# Patient Record
Sex: Female | Born: 1995 | Hispanic: No | Marital: Single | Smoking: Never smoker
Health system: Southern US, Community
[De-identification: ages and names within clinical notes are randomized; demographics above are authoritative.]

## PROBLEM LIST (undated history)

## (undated) DIAGNOSIS — E559 Vitamin D deficiency, unspecified: Secondary | ICD-10-CM

## (undated) HISTORY — PX: TYMPANOSTOMY TUBE PLACEMENT: SHX32

## (undated) HISTORY — DX: Vitamin D deficiency, unspecified: E55.9

## (undated) HISTORY — PX: APPENDECTOMY: SHX54

---
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
## 2015-12-22 ENCOUNTER — Ambulatory Visit (INDEPENDENT_AMBULATORY_CARE_PROVIDER_SITE_OTHER): Payer: Managed Care, Other (non HMO) | Admitting: Family Medicine

## 2015-12-22 VITALS — BP 114/68 | HR 86 | Temp 98.7°F | Resp 16 | Ht 59.0 in | Wt 106.2 lb

## 2015-12-22 DIAGNOSIS — H9202 Otalgia, left ear: Secondary | ICD-10-CM | POA: Diagnosis not present

## 2015-12-22 DIAGNOSIS — H7202 Central perforation of tympanic membrane, left ear: Secondary | ICD-10-CM | POA: Diagnosis not present

## 2015-12-22 DIAGNOSIS — Z23 Encounter for immunization: Secondary | ICD-10-CM

## 2015-12-22 DIAGNOSIS — Z0000 Encounter for general adult medical examination without abnormal findings: Secondary | ICD-10-CM

## 2015-12-22 NOTE — Progress Notes (Signed)
Chief Complaint  Patient presents with  . Annual Exam    no pap    Subjective:  Karen Conner is a 20 y.o. female here for a health maintenance visit.  Patient is  Pt established pt  Additional Problems addressed today She reports that she has been having ear pain with an echo and tinnitus Reports that her ear pain is only on the left side Denies history of trauma Denies using qtips or sharp objects to clean the ear Denies history of swimming or diving Reports that she had swimmers ear over the summer Made an appointment on Friday, 3 days ago, to come in due to the ear pain and discomfort with chewing. 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 Perforated ear drum 05/23/2013  . Abnormal hearing screen 05/23/2013    No past medical history on file.  No past surgical history on file.    No outpatient prescriptions prior to visit.   No facility-administered medications prior to visit.     No Known Allergies  Family History  Problem Relation Age of Onset  . Hypertension Maternal Grandmother   . Hypertension Maternal Grandfather   . Hypertension Paternal Grandmother   . Hypertension Paternal Grandfather     Social History   Social History  . Marital status: Single    Spouse name: N/A  . Number of children: N/A  . Years of education: N/A   Occupational History  . Not on file.   Social History Main Topics  . Smoking status: Never Smoker  . Smokeless tobacco: Not on file  . Alcohol use Not on file  . Drug use: Unknown  . Sexual activity: Not on file   Other Topics Concern  . Not on file   Social History Narrative  . No narrative on file   History  Alcohol use Not on file   History  Smoking Status  . Never Smoker  Smokeless Tobacco  . Not on file   History  Drug use: Unknown    GYN: Sexual Health Menstrual status: regular menses LMP:  Patient's last menstrual period was 12/19/2015. Last pap smear: see HM section History of abnormal pap smears: n/a Sexually active: no Current contraception: none  Health Maintenance: See under health Maintenance activity for review of completion dates as well. Immunization History  Administered Date(s) Administered  . Influenza,inj,Quad PF,36+ Mos 12/22/2015  . Influenza,inj,quad, With Preservative 04/25/2013    [facility] 12/22/2015 04/25/2013  Decreased Interest 0 0  Down, Depressed, Hopeless 0 0  PHQ - 2 Score 0 0  Altered sleeping - 0  Change in appetite - 0  Feeling bad or failure about yourself  - 0  Trouble concentrating - 0  Suicidal thoughts - 0  PHQ-9 Score - 0     Depression Severity and Treatment Recommendations:  0-4= None  5-9= Mild / Treatment: Support, educate to call if worse; return in one month  10-14= Moderate / Treatment: Support, watchful waiting; Antidepressant or Psycotherapy  15-19= Moderately severe / Treatment: Antidepressant OR Psychotherapy  >= 20 = Major depression, severe / Antidepressant AND Psychotherapy    Review of Systems   Review of Systems  Constitutional: Negative for chills, fever and weight loss.  HENT: Positive for ear pain. Negative for hearing loss.        Left ear pain and ringing started  3 days ago  Eyes: Negative for blurred vision, double vision and photophobia.  Cardiovascular: Negative for chest pain and palpitations.  Gastrointestinal: Negative for abdominal pain, heartburn, nausea and vomiting.  Genitourinary: Negative for dysuria, frequency and urgency.  Musculoskeletal: Negative for myalgias and neck pain.  Skin: Negative for itching and rash.  Neurological: Negative for dizziness, tingling, tremors and headaches.  Endo/Heme/Allergies: Negative for environmental allergies and polydipsia. Does not bruise/bleed easily.  Psychiatric/Behavioral: Negative for depression. The patient is not nervous/anxious  and does not have insomnia.     See HPI for ROS as well.    Objective:   Vitals:   12/22/15 1353  BP: 114/68  Pulse: 86  Resp: 16  Temp: 98.7 F (37.1 C)  TempSrc: Oral  SpO2: 100%  Weight: 106 lb 3.2 oz (48.2 kg)  Height: 4\' 11"  (1.499 m)   Body mass index is 21.45 kg/m.   Body mass index is 21.45 kg/m.  Physical Exam  Constitutional: She is oriented to person, place, and time. She appears well-developed and well-nourished.  HENT:  Head: Normocephalic and atraumatic.  Right Ear: Hearing and external ear normal.  Left Ear: Hearing normal. No drainage. Tympanic membrane is perforated. Tympanic membrane is not erythematous.  Mouth/Throat: Oropharynx is clear and moist.  Eyes: Conjunctivae and EOM are normal.  Neck: Normal range of motion. Neck supple.  Cardiovascular: Normal rate and normal heart sounds.   Pulmonary/Chest: Effort normal and breath sounds normal.  Abdominal: Soft. Bowel sounds are normal. She exhibits no distension. There is no tenderness.  Musculoskeletal: Normal range of motion. She exhibits no edema.  Neurological: She is alert and oriented to person, place, and time. She has normal reflexes.  Skin: Skin is warm. No erythema.  Psychiatric: She has a normal mood and affect. Her behavior is normal. Judgment and thought content normal.       Assessment/Plan:   Patient was seen for a health maintenance exam.  Counseled the patient on health maintenance issues. Reviewed her health mainteance schedule and ordered appropriate tests (see orders.) Counseled on regular exercise and weight management. Recommend regular eye exams and dental cleaning.   The following issues were addressed today for health maintenance:  Karen Conner was seen today for annual exam.  Diagnoses and all orders for this visit:  Health maintenance examination- age appropriate screenings reviewed Pap advised after age 71   Flu vaccine need- discussed risks and benefits, flu vaccine  given today -     Flu Vaccine QUAD 36+ mos IM  Central perforation of tympanic membrane of left ear Otalgia of left ear No trauma on history -     Ambulatory referral to ENT    No Follow-up on file.   Body mass index is 21.45 kg/m.:  Discussed the patient's BMI with patient.   No future appointments.  Patient Instructions       IF you received an x-ray today, you will receive an invoice from [facility]. Please contact [facility] at [phone] with questions or concerns regarding your invoice.   IF you received labwork today, you will receive an invoice from [facility]. Please contact [facility] at [phone] with questions or concerns regarding your invoice.   Our billing staff will not be able to assist you with questions regarding bills from these companies.  You will be contacted with the lab results as soon as they are available. The fastest way to get your results is to activate your My Chart account. Instructions are  located on the last page of this paperwork. If you have not heard from [place] regarding the results in 2 weeks, please contact this office.

## 2015-12-22 NOTE — Patient Instructions (Signed)
     IF you received an x-ray today, you will receive an invoice from [facility]. Please contact [facility] at [phone] with questions or concerns regarding your invoice.   IF you received labwork today, you will receive an invoice from [facility]. Please contact [facility] at [phone] with questions or concerns regarding your invoice.   Our billing staff will not be able to assist you with questions regarding bills from these companies.  You will be contacted with the lab results as soon as they are available. The fastest way to get your results is to activate your My Chart account. Instructions are located on the last page of this paperwork. If you have not heard from us regarding the results in 2 weeks, please contact this office.      

## 2018-01-20 ENCOUNTER — Other Ambulatory Visit: Payer: Self-pay

## 2018-01-20 ENCOUNTER — Encounter: Payer: Self-pay | Admitting: Physician Assistant

## 2018-01-20 ENCOUNTER — Ambulatory Visit (INDEPENDENT_AMBULATORY_CARE_PROVIDER_SITE_OTHER): Payer: Managed Care, Other (non HMO) | Admitting: Physician Assistant

## 2018-01-20 VITALS — BP 116/72 | HR 77 | Resp 16 | Ht 59.5 in | Wt 112.8 lb

## 2018-01-20 DIAGNOSIS — Z23 Encounter for immunization: Secondary | ICD-10-CM | POA: Diagnosis not present

## 2018-01-20 DIAGNOSIS — H7291 Unspecified perforation of tympanic membrane, right ear: Secondary | ICD-10-CM

## 2018-01-20 DIAGNOSIS — H6983 Other specified disorders of Eustachian tube, bilateral: Secondary | ICD-10-CM

## 2018-01-20 DIAGNOSIS — Z0001 Encounter for general adult medical examination with abnormal findings: Secondary | ICD-10-CM | POA: Diagnosis not present

## 2018-01-20 DIAGNOSIS — H6993 Unspecified Eustachian tube disorder, bilateral: Secondary | ICD-10-CM

## 2018-01-20 DIAGNOSIS — Z13228 Encounter for screening for other metabolic disorders: Secondary | ICD-10-CM | POA: Diagnosis not present

## 2018-01-20 DIAGNOSIS — Z1329 Encounter for screening for other suspected endocrine disorder: Secondary | ICD-10-CM | POA: Diagnosis not present

## 2018-01-20 DIAGNOSIS — Z13220 Encounter for screening for lipoid disorders: Secondary | ICD-10-CM | POA: Diagnosis not present

## 2018-01-20 DIAGNOSIS — Z0000 Encounter for general adult medical examination without abnormal findings: Secondary | ICD-10-CM

## 2018-01-20 DIAGNOSIS — Z130 Encounter for screening for diseases of the blood and blood-forming organs and certain disorders involving the immune mechanism: Secondary | ICD-10-CM | POA: Diagnosis not present

## 2018-01-20 LAB — POCT URINALYSIS DIP (MANUAL ENTRY)
Bilirubin, UA: NEGATIVE
Glucose, UA: NEGATIVE mg/dL
Leukocytes, UA: NEGATIVE
Nitrite, UA: NEGATIVE
Protein Ur, POC: 30 mg/dL — AB
Spec Grav, UA: >=1.03 — AB (ref 1.010–1.025)
Urobilinogen, UA: 0.2 U/dL
pH, UA: 6 (ref 5.0–8.0)

## 2018-01-20 MED ORDER — TRIAMCINOLONE ACETONIDE 55 MCG/ACT NA AERO: 2.0000 | INHALATION_SPRAY | Freq: Every day | NASAL | 12 refills | Status: DC

## 2018-01-20 NOTE — Progress Notes (Signed)
[facility] at [facility] [address] [place] [address] [phone]  Date:  01/20/2018   Name:  Karen Conner   DOB:  April 30, 1995   MRN:  [id number]  PCP:  Patient, No Pcp Per    Chief Complaint: Annual Exam (no pap)   History of Present Illness:  This is a 22 y.o. female with PMH none who is presenting for CPE. Last CPE 12/2015.  [employer] grad in business admin. Works in Engineering geologist. Lives with parents.   Complaints: ear problem. Occasional right ear pain and it "feels stuffy". This has been a problem for her for a while. Feels like it needs to "pop". Never taken anything for this.  LMP: 12/29/2017 Contraception: none Last pap: never. Declines this today. She is nervous about a GYN exam.  Sexual history: never active.  Immunizations: needs tdap Dentist: it's been >4 years.  Eye: 20/15  Diet/Exercise: no exercise. Eats fruits, little bit of veggies. Mostly eats rice and meat. Occasional fish. Eats fast food 2x/week.  Tobacco/alcohol/substance use: none  Review of Systems:  Review of Systems  Constitutional: Negative for chills, diaphoresis, fatigue and fever.  HENT: Positive for ear pain. Negative for congestion, ear discharge, postnasal drip, rhinorrhea, sinus pressure, sneezing and sore throat.   Respiratory: Negative for cough, chest tightness, shortness of breath and wheezing.   Cardiovascular: Negative for chest pain and palpitations.  Gastrointestinal: Negative for abdominal pain, diarrhea, nausea and vomiting.  Genitourinary: Negative for decreased urine volume, difficulty urinating, dysuria, enuresis, flank pain, frequency, hematuria, urgency, vaginal bleeding, vaginal discharge and vaginal pain.  Musculoskeletal: Negative for back pain.  Neurological: Negative for dizziness, weakness, light-headedness, numbness and headaches.    Patient Active Problem List   Diagnosis Date Noted  . Perforated ear drum 05/23/2013  . Abnormal hearing screen 05/23/2013    Prior to  Admission medications   Not on File    No Known Allergies   Social History   Tobacco Use  . Smoking status: Never Smoker  Substance Use Topics  . Alcohol use: Not on file  . Drug use: Not on file    Family History  Problem Relation Age of Onset  . Hypertension Maternal Grandmother   . Hypertension Maternal Grandfather   . Hypertension Paternal Grandmother   . Hypertension Paternal Grandfather     Medication list has been reviewed and updated.  Physical Examination:  Blood pressure 116/72, pulse 77, resp. rate 16, height 4' 11.5" (1.511 m), weight 112 lb 12.8 oz (51.2 kg), last menstrual period 12/29/2017, SpO2 99 %.   Physical Exam  Constitutional: She is oriented to person, place, and time. She appears well-developed and well-nourished. No distress.  HENT:  Head: Normocephalic and atraumatic.  Mouth/Throat: Oropharynx is clear and moist.  Perforated left TM Decreased hearing left ear  Eyes: Pupils are equal, round, and reactive to light. Conjunctivae and EOM are normal.  Neck: Normal range of motion. Neck supple. No thyromegaly present.  Cardiovascular: Normal rate, regular rhythm and normal heart sounds.  No murmur heard. Pulmonary/Chest: Effort normal and breath sounds normal. She has no wheezes.  Abdominal: Soft. Bowel sounds are normal. She exhibits no distension and no mass.  Musculoskeletal: Normal range of motion.  Neurological: She is alert and oriented to person, place, and time. She has normal reflexes.  Skin: Skin is warm and dry.  Psychiatric: She has a normal mood and affect. Her behavior is normal. Judgment and thought content normal.  Vitals reviewed.   Assessment and  Plan: 1. Annual physical exam - Pt presents for annual exam. She has never had a PAP and declines this today.  She endorses symptoms consistent with possible eustachian tube dysfunction. Perforated TM of left ear, which she says she was told about several years ago. Plan to refer to  ENT for evaluation. Routine labs are pending. Flu shot administered by [facility].  Anticipatory guidance discussed.   2. Screening for endocrine, metabolic and immunity disorder - CBC with Differential/Platelet - Comprehensive metabolic panel - POCT urinalysis dipstick - VITAMIN D 25 Hydroxy (Vit-D Deficiency, Fractures) - TSH - Lipid panel - Vitamin B12  3. Screening, lipid - Lipid panel  4. Flu vaccine need - Flu Vaccine QUAD 6+ mos PF IM (Fluarix Quad PF)  5. Dysfunction of both eustachian tubes - triamcinolone (NASACORT) 55 MCG/ACT AERO nasal inhaler; Place 2 sprays into the nose daily.  Dispense: 1 Inhaler; Refill: 12  6. Perforated right tympanic membrane on examination - Ambulatory referral to ENT   Marco Collie, PA-C  [facility] at [facility] 01/20/2018 4:25 PM

## 2018-01-20 NOTE — Patient Instructions (Addendum)
Make an appointment to have a routine teeth cleaning by a dentist. Every 6 months.  You will receive a phone call to schedule an appointment with the ear specialist.  Please consider getting screened for cervical cancer.   If you have lab work done today you will be contacted with your lab results within the next 2 weeks.  If you have not heard from [place] then please contact us. The fastest way to get your results is to register for My Chart.  To help eustachian tube dysfunction, use nasacort 2x/day in your nose and clear it by plugging your nose and blowing out 5-10 x/day Consider allergy medicines during your "bad seasons".  Eustachian Tube Dysfunction The eustachian tube connects the middle ear to the back of the nose. It regulates air pressure in the middle ear by allowing air to move between the ear and nose. It also helps to drain fluid from the middle ear space. When the eustachian tube does not function properly, air pressure, fluid, or both can build up in the middle ear. Eustachian tube dysfunction can affect one or both ears. What are the causes? This condition happens when the eustachian tube becomes blocked or cannot open normally. This may result from:  Ear infections.  Colds and other upper respiratory infections.  Allergies.  Irritation, such as from cigarette smoke or acid from the stomach coming up into the esophagus (gastroesophageal reflux).  Sudden changes in air pressure, such as from descending in an airplane.  Abnormal growths in the nose or throat, such as nasal polyps, tumors, or enlarged tissue at the back of the throat (adenoids).  What increases the risk? This condition may be more likely to develop in people who smoke and people who are overweight. Eustachian tube dysfunction may also be more likely to develop in children, especially children who have:  Certain birth defects of the mouth, such as cleft palate.  Large tonsils and adenoids.  What are the  signs or symptoms? Symptoms of this condition may include:  A feeling of fullness in the ear.  Ear pain.  Clicking or popping noises in the ear.  Ringing in the ear.  Hearing loss.  Loss of balance.  Symptoms may get worse when the air pressure around you changes, such as when you travel to an area of high elevation or fly on an airplane. How is this diagnosed? This condition may be diagnosed based on:  Your symptoms.  A physical exam of your ear, nose, and throat.  Tests, such as those that measure: ? The movement of your eardrum (tympanogram). ? Your hearing (audiometry).  How is this treated? Treatment depends on the cause and severity of your condition. If your symptoms are mild, you may be able to relieve your symptoms by moving air into ("popping") your ears. If you have symptoms of fluid in your ears, treatment may include:  Decongestants.  Antihistamines.  Nasal sprays or ear drops that contain medicines that reduce swelling (steroids).  In some cases, you may need to have a procedure to drain the fluid in your eardrum (myringotomy). In this procedure, a small tube is placed in the eardrum to:  Drain the fluid.  Restore the air in the middle ear space.  Follow these instructions at home:  Take over-the-counter and prescription medicines only as told by your health care provider.  Use techniques to help pop your ears as recommended by your health care provider. These may include: ? Chewing gum. ? Yawning. ?  Frequent, forceful swallowing. ? Closing your mouth, holding your nose closed, and gently blowing as if you are trying to blow air out of your nose.  Do not do any of the following until your health care provider approves: ? Travel to high altitudes. ? Fly in airplanes. ? Work in a Estate agent or room. ? Scuba dive.  Keep your ears dry. Dry your ears completely after showering or bathing.  Do not smoke.  Keep all follow-up visits as told  by your health care provider. This is important. Contact a health care provider if:  Your symptoms do not go away after treatment.  Your symptoms come back after treatment.  You are unable to pop your ears.  You have: ? A fever. ? Pain in your ear. ? Pain in your head or neck. ? Fluid draining from your ear.  Your hearing suddenly changes.  You become very dizzy.  You lose your balance. This information is not intended to replace advice given to you by your health care provider. Make sure you discuss any questions you have with your health care provider. Document Released: 03/28/2015 Document Revised: 08/07/2015 Document Reviewed: 03/20/2014 Elsevier Interactive Patient Education  2018 [employer].  ------------------------------------------------------ Keeping You Healthy   Get These Tests  Blood Pressure- Have your blood pressure checked by your healthcare provider at least once a year.  Normal blood pressure is 120/80.  Weight- Have your body mass index (BMI) calculated to screen for obesity.  BMI is a measure of body fat based on height and weight.  You can calculate your own BMI at https://www.west-esparza.com/  Cholesterol- Have your cholesterol checked every year.  Diabetes- Have your blood sugar checked every year if you have high blood pressure, high cholesterol, a family history of diabetes or if you are overweight.  Pap Test - Have a pap test every 1 to 5 years if you have been sexually active.  If you are older than 65 and recent pap tests have been normal you may not need additional pap tests.  In addition, if you have had a hysterectomy  for benign disease additional pap tests are not necessary.   Get these medicines  Calcium with Vitamin D- Your body requires 1200-1500 mg of Calcium a day and [id number] IU of Vitamin D a day.  You can only absorb 500 mg of Calcium at a time therefore Calcium must be taken in 2 or 3 separate doses throughout the day.  Hormones-  Hormone therapy has been associated with increased risk for certain cancers and heart disease.  Talk to your healthcare provider about if you need relief from menopausal symptoms.  Aspirin- Ask your healthcare provider about taking Aspirin to prevent Heart Disease and Stroke.   Get these Immuniztions  Flu shot- Every fall.  Tetanus- Every ten years.   Take these steps  Don't smoke- Your healthcare provider can help you quit. For tips on how to quit, ask your healthcare provider or go to www.smokefree.gov or call 1-800 QUIT-NOW.  Be physically active- Exercise 5 days a week for a minimum of 30 minutes.  If you are not already physically active, start slow and gradually work up to 30 minutes of moderate physical activity.  Try walking, dancing, bike riding, swimming, etc.  Eat a healthy diet- Eat a variety of healthy foods such as fruits, vegetables, whole grains, low fat milk, low fat cheeses, yogurt, lean meats, chicken, fish, eggs, dried beans, tofu, etc.  For more information go to  www.thenutritionsource.org  Dental visit- Brush and floss teeth twice daily; visit your dentist twice a year.  Eye exam- Visit your Optometrist or Ophthalmologist yearly.  Drink alcohol in moderation- Limit alcohol intake to one drink or less a day.  Never drink and drive.  Depression- Your emotional health is as important as your physical health.  If you're feeling down or losing interest in things you normally enjoy, please talk to your healthcare provider.  Seat Belts- can save your life; always wear one  Smoke/Carbon Monoxide detectors- These detectors need to be installed on the appropriate level of your home.  Replace batteries at least once a year.  Violence- If anyone is threatening or hurting you, please tell your healthcare provider.  Living Will/ Health care power of attorney- Discuss with your healthcare provider and family.  IF you received an x-ray today, you will receive an invoice from  [facility]. Please contact [facility] at [phone] with questions or concerns regarding your invoice.   IF you received labwork today, you will receive an invoice from [facility]. Please contact [facility] at [phone] with questions or concerns regarding your invoice.   Our billing staff will not be able to assist you with questions regarding bills from these companies.  You will be contacted with the lab results as soon as they are available. The fastest way to get your results is to activate your My Chart account. Instructions are located on the last page of this paperwork. If you have not heard from [place] regarding the results in 2 weeks, please contact this office.

## 2018-01-21 LAB — COMPREHENSIVE METABOLIC PANEL
Albumin: 4.9 g/dL (ref 3.5–5.5)
BUN/Creatinine Ratio: 19 (ref 9–23)
CO2: 22 mmol/L (ref 20–29)
Calcium: 9.6 mg/dL (ref 8.7–10.2)
Creatinine, Ser: 0.59 mg/dL (ref 0.57–1.00)
GFR calc Af Amer: 150 mL/min/{1.73_m2} (ref >59)
GFR calc non Af Amer: 131 mL/min/{1.73_m2} (ref >59)
Globulin, Total: 2.8 g/dL (ref 1.5–4.5)
Potassium: 4.1 mmol/L (ref 3.5–5.2)
Sodium: 138 mmol/L (ref 134–144)
Total Protein: 7.7 g/dL (ref 6.0–8.5)

## 2018-01-21 LAB — CBC WITH DIFFERENTIAL/PLATELET
Basophils Absolute: 0 10*3/uL (ref 0.0–0.2)
Basos: 0 %
EOS (ABSOLUTE): 0 10*3/uL (ref 0.0–0.4)
Eos: 1 %
Hematocrit: 38 % (ref 34.0–46.6)
Hemoglobin: 12.7 g/dL (ref 11.1–15.9)
Immature Grans (Abs): 0 10*3/uL (ref 0.0–0.1)
Immature Granulocytes: 0 %
Lymphocytes Absolute: 2 10*3/uL (ref 0.7–3.1)
Lymphs: 28 %
MCH: 29.7 pg (ref 26.6–33.0)
MCHC: 33.4 g/dL (ref 31.5–35.7)
MCV: 89 fL (ref 79–97)
Monocytes Absolute: 0.6 10*3/uL (ref 0.1–0.9)
Monocytes: 9 %
Neutrophils Absolute: 4.4 10*3/uL (ref 1.4–7.0)
Neutrophils: 62 %
Platelets: 291 10*3/uL (ref 150–450)
RBC: 4.28 x10E6/uL (ref 3.77–5.28)
RDW: 11.9 % — ABNORMAL LOW (ref 12.3–15.4)
WBC: 7 10*3/uL (ref 3.4–10.8)

## 2018-01-21 LAB — COMPREHENSIVE METABOLIC PANEL WITH GFR
ALT: 10 IU/L (ref 0–32)
AST: 17 IU/L (ref 0–40)
Albumin/Globulin Ratio: 1.8 (ref 1.2–2.2)
Alkaline Phosphatase: 41 IU/L (ref 39–117)
BUN: 11 mg/dL (ref 6–20)
Bilirubin Total: 0.3 mg/dL (ref 0.0–1.2)
Chloride: 100 mmol/L (ref 96–106)
Glucose: 75 mg/dL (ref 65–99)

## 2018-01-21 LAB — LIPID PANEL
Chol/HDL Ratio: 2.5 ratio (ref 0.0–4.4)
Cholesterol, Total: 167 mg/dL (ref 100–199)
HDL: 68 mg/dL (ref >39)
LDL Calculated: 88 mg/dL (ref 0–99)
Triglycerides: 54 mg/dL (ref 0–149)
VLDL Cholesterol Cal: 11 mg/dL (ref 5–40)

## 2018-01-21 LAB — VITAMIN D 25 HYDROXY (VIT D DEFICIENCY, FRACTURES): Vit D, 25-Hydroxy: 27.1 ng/mL — ABNORMAL LOW (ref 30.0–100.0)

## 2018-01-21 LAB — VITAMIN B12: Vitamin B-12: 266 pg/mL (ref 232–1245)

## 2018-01-21 LAB — TSH: TSH: 4.34 u[IU]/mL (ref 0.450–4.500)

## 2018-09-07 ENCOUNTER — Other Ambulatory Visit: Payer: Self-pay

## 2018-09-07 ENCOUNTER — Ambulatory Visit (INDEPENDENT_AMBULATORY_CARE_PROVIDER_SITE_OTHER): Payer: 59 | Admitting: Family Medicine

## 2018-09-07 DIAGNOSIS — Z23 Encounter for immunization: Secondary | ICD-10-CM | POA: Diagnosis not present

## 2018-09-07 NOTE — Progress Notes (Signed)
Nurse visit only

## 2019-02-20 ENCOUNTER — Encounter: Payer: Self-pay | Admitting: Family Medicine

## 2019-02-20 ENCOUNTER — Ambulatory Visit (INDEPENDENT_AMBULATORY_CARE_PROVIDER_SITE_OTHER): Payer: 59 | Admitting: Family Medicine

## 2019-02-20 ENCOUNTER — Other Ambulatory Visit: Payer: Self-pay

## 2019-02-20 VITALS — BP 108/76 | HR 81 | Temp 98.7°F | Ht 59.5 in | Wt 107.0 lb

## 2019-02-20 DIAGNOSIS — Z13220 Encounter for screening for lipoid disorders: Secondary | ICD-10-CM | POA: Diagnosis not present

## 2019-02-20 DIAGNOSIS — E559 Vitamin D deficiency, unspecified: Secondary | ICD-10-CM | POA: Diagnosis not present

## 2019-02-20 DIAGNOSIS — Z23 Encounter for immunization: Secondary | ICD-10-CM | POA: Diagnosis not present

## 2019-02-20 DIAGNOSIS — Z1329 Encounter for screening for other suspected endocrine disorder: Secondary | ICD-10-CM

## 2019-02-20 DIAGNOSIS — Z0000 Encounter for general adult medical examination without abnormal findings: Secondary | ICD-10-CM

## 2019-02-20 DIAGNOSIS — Z0001 Encounter for general adult medical examination with abnormal findings: Secondary | ICD-10-CM | POA: Diagnosis not present

## 2019-02-20 DIAGNOSIS — Z130 Encounter for screening for diseases of the blood and blood-forming organs and certain disorders involving the immune mechanism: Secondary | ICD-10-CM

## 2019-02-20 DIAGNOSIS — Z13228 Encounter for screening for other metabolic disorders: Secondary | ICD-10-CM

## 2019-02-20 NOTE — Progress Notes (Signed)
12/8/20202:53 PM  Karen Conner Jul 28, 1995, 23 y.o., female [id number]  Chief Complaint  Patient presents with  . Annual Exam    HPI:   Patient is a 23 y.o. female  who presents today for CPE  Last CPE Nov 2019 G&Ps: 0 Pap: has never had a pap, has never been sexually active STD: declines BC : declines Menses: menarche at age13, regular, cramping first 2 days, normal flow Mammogram: n/a FHX breast/ovarian cancer: none FHx colon cancer: none Exercise/diet: does not exercise, follows a culturally average diet Eye: within past year, wears eyeglasses Dentist: has not seen this year Has vitamin D deficiency: struggles with taking supplements, needs to be gelatin free and not too big In her first year for [employer] program Postponed repair of TM due to covid  Most Recent Immunizations  Administered Date(s) Administered  . Influenza,inj,Quad PF,6+ Mos 12/22/2015  . Influenza,inj,quad, With Preservative 04/25/2013  . Tdap 09/07/2018  states she has had HPV vaccines  Depression screen [facility] 2/9 02/20/2019 01/20/2018 12/22/2015  Decreased Interest 0 0 0  Down, Depressed, Hopeless 0 0 0  PHQ - 2 Score 0 0 0  Altered sleeping - - -  Change in appetite - - -  Feeling bad or failure about yourself  - - -  Trouble concentrating - - -  Suicidal thoughts - - -  PHQ-9 Score - - -    Fall Risk  02/20/2019 01/20/2018 12/22/2015  Falls in the past year? 0 0 No  Number falls in past yr: 0 - -  Injury with Fall? 0 - -     No Known Allergies  Prior to Admission medications   Not on File    No past medical history on file.  History reviewed. No pertinent surgical history.  Social History   Tobacco Use  . Smoking status: Never Smoker  . Smokeless tobacco: Never Used  Substance Use Topics  . Alcohol use: Never    Frequency: Never    Family History  Problem Relation Age of Onset  . Hypertension Maternal Grandmother   . Hypertension Maternal Grandfather   . Hypertension Paternal  Grandmother   . Hypertension Paternal Grandfather   . Healthy Mother   . Healthy Father   . Healthy Sister   . Healthy Brother     Review of Systems  Constitutional: Negative for chills and fever.  Respiratory: Negative for cough and shortness of breath.   Cardiovascular: Negative for chest pain, palpitations and leg swelling.  Gastrointestinal: Negative for abdominal pain, nausea and vomiting.  All other systems reviewed and are negative.    OBJECTIVE:  Today's Vitals   02/20/19 1448  BP: 108/76  Pulse: 81  Temp: 98.7 F (37.1 C)  SpO2: 100%  Weight: 107 lb (48.5 kg)  Height: 4' 11.5" (1.511 m)   Body mass index is 21.25 kg/m.   Physical Exam Vitals signs and nursing note reviewed.  Constitutional:      Appearance: She is well-developed.  HENT:     Head: Normocephalic and atraumatic.     Right Ear: Hearing, tympanic membrane, ear canal and external ear normal.     Left Ear: Hearing, tympanic membrane, ear canal and external ear normal.     Mouth/Throat:     Mouth: Mucous membranes are moist.     Pharynx: No oropharyngeal exudate or posterior oropharyngeal erythema.  Eyes:     Extraocular Movements: Extraocular movements intact.     Conjunctiva/sclera: Conjunctivae normal.     Pupils:  Pupils are equal, round, and reactive to light.  Neck:     Musculoskeletal: Neck supple.     Thyroid: No thyromegaly.  Cardiovascular:     Rate and Rhythm: Normal rate and regular rhythm.     Heart sounds: Normal heart sounds. No murmur. No friction rub. No gallop.   Pulmonary:     Effort: Pulmonary effort is normal.     Breath sounds: Normal breath sounds. No wheezing, rhonchi or rales.  Abdominal:     General: Bowel sounds are normal. There is no distension.     Palpations: Abdomen is soft. There is no hepatomegaly, splenomegaly or mass.     Tenderness: There is no abdominal tenderness.  Musculoskeletal: Normal range of motion.     Right lower leg: No edema.     Left  lower leg: No edema.  Lymphadenopathy:     Cervical: No cervical adenopathy.  Skin:    General: Skin is warm and dry.  Neurological:     Mental Status: She is alert and oriented to person, place, and time.     Cranial Nerves: No cranial nerve deficit.     Gait: Gait normal.     Deep Tendon Reflexes: Reflexes are normal and symmetric.  Psychiatric:        Mood and Affect: Mood normal.        Behavior: Behavior normal.     No results found for this or any previous visit (from the past 24 hour(s)).  No results found.   ASSESSMENT and PLAN  1. Annual physical exam No concerns per history or exam. Routine HCM labs ordered. HCM reviewed/discussed. Anticipatory guidance regarding healthy weight, lifestyle and choices given.   2. Need for prophylactic vaccination and inoculation against influenza - Flu Vaccine  3. Vitamin D deficiency - Vitamin D 25 (osteoporosis screening)  4. Screening for endocrine, metabolic and immunity disorder - CBC - Comprehensive metabolic panel - TSH  5. Screening, lipid - Lipid panel  Return in about 1 year (around 02/20/2020).    Myles Lipps, MD Primary Care at [facility] [address] [place], [place] [address] Ph.  [phone] Fax [phone]

## 2019-02-20 NOTE — Patient Instructions (Addendum)
Preventive Care 21-23 Years Old, Female °Preventive care refers to visits with your health care provider and lifestyle choices that can promote health and wellness. This includes: °· A yearly physical exam. This may also be called an annual well check. °· Regular dental visits and eye exams. °· Immunizations. °· Screening for certain conditions. °· Healthy lifestyle choices, such as eating a healthy diet, getting regular exercise, not using drugs or products that contain nicotine and tobacco, and limiting alcohol use. °What can I expect for my preventive care visit? °Physical exam °Your health care provider will check your: °· Height and weight. This may be used to calculate body mass index (BMI), which tells if you are at a healthy weight. °· Heart rate and blood pressure. °· Skin for abnormal spots. °Counseling °Your health care provider may ask you questions about your: °· Alcohol, tobacco, and drug use. °· Emotional well-being. °· Home and relationship well-being. °· Sexual activity. °· Eating habits. °· Work and work environment. °· Method of birth control. °· Menstrual cycle. °· Pregnancy history. °What immunizations do I need? ° °Influenza (flu) vaccine °· This is recommended every year. °Tetanus, diphtheria, and pertussis (Tdap) vaccine °· You may need a Td booster every 10 years. °Varicella (chickenpox) vaccine °· You may need this if you have not been vaccinated. °Human papillomavirus (HPV) vaccine °· If recommended by your health care provider, you may need three doses over 6 months. °Measles, mumps, and rubella (MMR) vaccine °· You may need at least one dose of MMR. You may also need a second dose. °Meningococcal conjugate (MenACWY) vaccine °· One dose is recommended if you are age 19-21 years and a first-year college student living in a residence hall, or if you have one of several medical conditions. You may also need additional booster doses. °Pneumococcal conjugate (PCV13) vaccine °· You may need  this if you have certain conditions and were not previously vaccinated. °Pneumococcal polysaccharide (PPSV23) vaccine °· You may need one or two doses if you smoke cigarettes or if you have certain conditions. °Hepatitis A vaccine °· You may need this if you have certain conditions or if you travel or work in places where you may be exposed to hepatitis A. °Hepatitis B vaccine °· You may need this if you have certain conditions or if you travel or work in places where you may be exposed to hepatitis B. °Haemophilus influenzae type b (Hib) vaccine °· You may need this if you have certain conditions. °You may receive vaccines as individual doses or as more than one vaccine together in one shot (combination vaccines). Talk with your health care provider about the risks and benefits of combination vaccines. °What tests do I need? ° °Blood tests °· Lipid and cholesterol levels. These may be checked every 5 years starting at age 20. °· Hepatitis C test. °· Hepatitis B test. °Screening °· Diabetes screening. This is done by checking your blood sugar (glucose) after you have not eaten for a while (fasting). °· Sexually transmitted disease (STD) testing. °· BRCA-related cancer screening. This may be done if you have a family history of breast, ovarian, tubal, or peritoneal cancers. °· Pelvic exam and Pap test. This may be done every 3 years starting at age 21. Starting at age 30, this may be done every 5 years if you have a Pap test in combination with an HPV test. °Talk with your health care provider about your test results, treatment options, and if necessary, the need for more tests. °  more tests. Follow these instructions at home: Eating and drinking   Eat a diet that includes fresh fruits and vegetables, whole grains, lean protein, and low-fat dairy.  Take vitamin and mineral supplements as recommended by your health care provider.  Do not drink alcohol if: ? Your health care provider tells you not to drink. ? You are  pregnant, may be pregnant, or are planning to become pregnant.  If you drink alcohol: ? Limit how much you have to 0-1 drink a day. ? Be aware of how much alcohol is in your drink. In the [place] one drink equals one 12 oz bottle of beer (355 mL), one 5 oz glass of wine (148 mL), or one 1 oz glass of hard liquor (44 mL). Lifestyle  Take daily care of your teeth and gums.  Stay active. Exercise for at least 30 minutes on 5 or more days each week.  Do not use any products that contain nicotine or tobacco, such as cigarettes, e-cigarettes, and chewing tobacco. If you need help quitting, ask your health care provider.  If you are sexually active, practice safe sex. Use a condom or other form of birth control (contraception) in order to prevent pregnancy and STIs (sexually transmitted infections). If you plan to become pregnant, see your health care provider for a preconception visit. What's next?  Visit your health care provider once a year for a well check visit.  Ask your health care provider how often you should have your eyes and teeth checked.  Stay up to date on all vaccines. This information is not intended to replace advice given to you by your health care provider. Make sure you discuss any questions you have with your health care provider. Document Released: 04/27/2001 Document Revised: 11/10/2017 Document Reviewed: 11/10/2017 Elsevier Patient Education  [employer].   If you have lab work done today you will be contacted with your lab results within the next 2 weeks.  If you have not heard from [place] then please contact us. The fastest way to get your results is to register for My Chart.   IF you received an x-ray today, you will receive an invoice from [facility]. Please contact [facility] at [phone] with questions or concerns regarding your invoice.   IF you received labwork today, you will receive an invoice from [place]. Please contact [facility] at  [phone] with questions or concerns regarding your invoice.   Our billing staff will not be able to assist you with questions regarding bills from these companies.  You will be contacted with the lab results as soon as they are available. The fastest way to get your results is to activate your My Chart account. Instructions are located on the last page of this paperwork. If you have not heard from [place] regarding the results in 2 weeks, please contact this office.

## 2019-02-21 LAB — CBC
Hematocrit: 38.2 % (ref 34.0–46.6)
Hemoglobin: 12.8 g/dL (ref 11.1–15.9)
MCH: 29.4 pg (ref 26.6–33.0)
MCHC: 33.5 g/dL (ref 31.5–35.7)
MCV: 88 fL (ref 79–97)
Platelets: 317 10*3/uL (ref 150–450)
RBC: 4.35 x10E6/uL (ref 3.77–5.28)
RDW: 12.3 % (ref 11.7–15.4)
WBC: 4.7 10*3/uL (ref 3.4–10.8)

## 2019-02-21 LAB — LIPID PANEL
Chol/HDL Ratio: 2.5 ratio (ref 0.0–4.4)
Cholesterol, Total: 163 mg/dL (ref 100–199)
HDL: 64 mg/dL (ref >39)
LDL Chol Calc (NIH): 87 mg/dL (ref 0–99)
Triglycerides: 59 mg/dL (ref 0–149)
VLDL Cholesterol Cal: 12 mg/dL (ref 5–40)

## 2019-02-21 LAB — COMPREHENSIVE METABOLIC PANEL
ALT: 6 IU/L (ref 0–32)
AST: 15 IU/L (ref 0–40)
Albumin/Globulin Ratio: 1.7 (ref 1.2–2.2)
Albumin: 4.7 g/dL (ref 3.9–5.0)
Alkaline Phosphatase: 40 IU/L (ref 39–117)
BUN/Creatinine Ratio: 16 (ref 9–23)
BUN: 9 mg/dL (ref 6–20)
Bilirubin Total: 0.3 mg/dL (ref 0.0–1.2)
CO2: 20 mmol/L (ref 20–29)
Calcium: 9.4 mg/dL (ref 8.7–10.2)
Chloride: 102 mmol/L (ref 96–106)
Creatinine, Ser: 0.58 mg/dL (ref 0.57–1.00)
GFR calc Af Amer: 150 mL/min/{1.73_m2} (ref >59)
GFR calc non Af Amer: 130 mL/min/{1.73_m2} (ref >59)
Globulin, Total: 2.7 g/dL (ref 1.5–4.5)
Glucose: 86 mg/dL (ref 65–99)
Potassium: 4.3 mmol/L (ref 3.5–5.2)
Sodium: 137 mmol/L (ref 134–144)
Total Protein: 7.4 g/dL (ref 6.0–8.5)

## 2019-02-21 LAB — TSH: TSH: 2.46 u[IU]/mL (ref 0.450–4.500)

## 2019-02-21 LAB — VITAMIN D 25 HYDROXY (VIT D DEFICIENCY, FRACTURES): Vit D, 25-Hydroxy: 10.8 ng/mL — ABNORMAL LOW (ref 30.0–100.0)

## 2019-02-26 MED ORDER — VITAMIN D (ERGOCALCIFEROL) 1.25 MG (50000 UNIT) PO CAPS: 50000.0000 [IU] | ORAL_CAPSULE | ORAL | 0 refills | Status: DC

## 2019-02-26 NOTE — Addendum Note (Signed)
Addended by: Rutherford Guys on: 02/26/2019 01:26 PM   Modules accepted: Orders

## 2020-05-29 ENCOUNTER — Other Ambulatory Visit: Payer: Self-pay

## 2020-05-29 ENCOUNTER — Ambulatory Visit (INDEPENDENT_AMBULATORY_CARE_PROVIDER_SITE_OTHER): Payer: 59 | Admitting: Family Medicine

## 2020-05-29 ENCOUNTER — Encounter: Payer: Self-pay | Admitting: Family Medicine

## 2020-05-29 VITALS — BP 111/70 | HR 75 | Temp 98.3°F | Ht 59.5 in | Wt 112.0 lb

## 2020-05-29 DIAGNOSIS — Z23 Encounter for immunization: Secondary | ICD-10-CM

## 2020-05-29 DIAGNOSIS — Z0000 Encounter for general adult medical examination without abnormal findings: Secondary | ICD-10-CM | POA: Diagnosis not present

## 2020-05-29 NOTE — Patient Instructions (Addendum)
  Health Maintenance, Female Adopting a healthy lifestyle and getting preventive care are important in promoting health and wellness. Ask your health care provider about:  The right schedule for you to have regular tests and exams.  Things you can do on your own to prevent diseases and keep yourself healthy. What should I know about diet, weight, and exercise? Eat a healthy diet  Eat a diet that includes plenty of vegetables, fruits, low-fat dairy products, and lean protein.  Do not eat a lot of foods that are high in solid fats, added sugars, or sodium.   Maintain a healthy weight Body mass index (BMI) is used to identify weight problems. It estimates body fat based on height and weight. Your health care provider can help determine your BMI and help you achieve or maintain a healthy weight. Get regular exercise Get regular exercise. This is one of the most important things you can do for your health. Most adults should:  Exercise for at least 150 minutes each week. The exercise should increase your heart rate and make you sweat (moderate-intensity exercise).  Do strengthening exercises at least twice a week. This is in addition to the moderate-intensity exercise.  Spend less time sitting. Even light physical activity can be beneficial. Watch cholesterol and blood lipids Have your blood tested for lipids and cholesterol at 25 years of age, then have this test every 5 years. Have your cholesterol levels checked more often if:  Your lipid or cholesterol levels are high.  You are older than 25 years of age.  You are at high risk for heart disease. What should I know about cancer screening? Depending on your health history and family history, you may need to have cancer screening at various ages. This may include screening for:  Breast cancer.  Cervical cancer.  Colorectal cancer.  Skin cancer.  Lung cancer. What should I know about heart disease, diabetes, and high blood  pressure? Blood pressure and heart disease  High blood pressure causes heart disease and increases the risk of stroke. This is more likely to develop in people who have high blood pressure readings, are of African descent, or are overweight.  Have your blood pressure checked: ? Every 3-5 years if you are 18-39 years of age. ? Every year if you are 40 years old or older. Diabetes Have regular diabetes screenings. This checks your fasting blood sugar level. Have the screening done:  Once every three years after age 40 if you are at a normal weight and have a low risk for diabetes.  More often and at a younger age if you are overweight or have a high risk for diabetes. What should I know about preventing infection? Hepatitis B If you have a higher risk for hepatitis B, you should be screened for this virus. Talk with your health care provider to find out if you are at risk for hepatitis B infection. Hepatitis C Testing is recommended for:  Everyone born from 1945 through 1965.  Anyone with known risk factors for hepatitis C. Sexually transmitted infections (STIs)  Get screened for STIs, including gonorrhea and chlamydia, if: ? You are sexually active and are younger than 24 years of age. ? You are older than 24 years of age and your health care provider tells you that you are at risk for this type of infection. ? Your sexual activity has changed since you were last screened, and you are at increased risk for chlamydia or gonorrhea. Ask your health   care provider if you are at risk.  Ask your health care provider about whether you are at high risk for HIV. Your health care provider may recommend a prescription medicine to help prevent HIV infection. If you choose to take medicine to prevent HIV, you should first get tested for HIV. You should then be tested every 3 months for as long as you are taking the medicine. Pregnancy  If you are about to stop having your period (premenopausal) and  you may become pregnant, seek counseling before you get pregnant.  Take 400 to 800 micrograms (mcg) of folic acid every day if you become pregnant.  Ask for birth control (contraception) if you want to prevent pregnancy. Osteoporosis and menopause Osteoporosis is a disease in which the bones lose minerals and strength with aging. This can result in bone fractures. If you are 65 years old or older, or if you are at risk for osteoporosis and fractures, ask your health care provider if you should:  Be screened for bone loss.  Take a calcium or vitamin D supplement to lower your risk of fractures.  Be given hormone replacement therapy (HRT) to treat symptoms of menopause. Follow these instructions at home: Lifestyle  Do not use any products that contain nicotine or tobacco, such as cigarettes, e-cigarettes, and chewing tobacco. If you need help quitting, ask your health care provider.  Do not use street drugs.  Do not share needles.  Ask your health care provider for help if you need support or information about quitting drugs. Alcohol use  Do not drink alcohol if: ? Your health care provider tells you not to drink. ? You are pregnant, may be pregnant, or are planning to become pregnant.  If you drink alcohol: ? Limit how much you use to 0-1 drink a day. ? Limit intake if you are breastfeeding.  Be aware of how much alcohol is in your drink. In the [place] one drink equals one 12 oz bottle of beer (355 mL), one 5 oz glass of wine (148 mL), or one 1 oz glass of hard liquor (44 mL). General instructions  Schedule regular health, dental, and eye exams.  Stay current with your vaccines.  Tell your health care provider if: ? You often feel depressed. ? You have ever been abused or do not feel safe at home. Summary  Adopting a healthy lifestyle and getting preventive care are important in promoting health and wellness.  Follow your health care provider's instructions about healthy  diet, exercising, and getting tested or screened for diseases.  Follow your health care provider's instructions on monitoring your cholesterol and blood pressure. This information is not intended to replace advice given to you by your health care provider. Make sure you discuss any questions you have with your health care provider. Document Revised: 02/22/2018 Document Reviewed: 02/22/2018 Elsevier Patient Education  2021 Elsevier Inc.   If you have lab work done today you will be contacted with your lab results within the next 2 weeks.  If you have not heard from us then please contact us. The fastest way to get your results is to register for My Chart.   IF you received an x-ray today, you will receive an invoice from [facility]. Please contact [facility] at [phone] with questions or concerns regarding your invoice.   IF you received labwork today, you will receive an invoice from [facility]. Please contact [facility] at [phone] with questions or concerns regarding your invoice.   Our billing   staff will not be able to assist you with questions regarding bills from these companies.  You will be contacted with the lab results as soon as they are available. The fastest way to get your results is to activate your My Chart account. Instructions are located on the last page of this paperwork. If you have not heard from us regarding the results in 2 weeks, please contact this office.      

## 2020-05-29 NOTE — Progress Notes (Signed)
3/17/20228:27 AM  Karen Conner [birth year], 25 y.o., female [id number]  Chief Complaint  Patient presents with  . Annual Exam    HPI:   Patient is a 25 y.o. female with no significant past medical history who presents today for CPE.  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 with MBA Has a job lined up post graduation Denies ED/UC visits, denies any surgeries Has not gotten covid booster Would like flu shot today  Has a history of tubes in her ears as a child Has been seeing ENT for this Are awaiting to see if they heal on their one Does have some left side hearing loss   Depression screen [facility] 2/9 05/29/2020 02/20/2019 01/20/2018  Decreased Interest 0 0 0  Down, Depressed, Hopeless 0 0 0  PHQ - 2 Score 0 0 0  Altered sleeping - - -  Change in appetite - - -  Feeling bad or failure about yourself  - - -  Trouble concentrating - - -  Suicidal thoughts - - -  PHQ-9 Score - - -    Fall Risk  05/29/2020 02/20/2019 01/20/2018 12/22/2015  Falls in the past year? 0 0 0 No  Number falls in past yr: 0 0 - -  Injury with Fall? 0 0 - -  Follow up Falls evaluation completed - - -     No Known Allergies  Prior to Admission medications   Not on File    History reviewed. No pertinent past medical history.  Past Surgical History:  Procedure Laterality Date  . APPENDECTOMY     at age 81    Social History   Tobacco Use  . Smoking status: Never Smoker  . Smokeless tobacco: Never Used  Substance Use Topics  . Alcohol use: Never    Family History  Problem Relation  Age of Onset  . Hypertension Maternal Grandmother   . Hypertension Maternal Grandfather   . Hypertension Paternal Grandmother   . Hypertension Paternal Grandfather   . Healthy Mother   . Healthy Father   . Healthy Sister   . Healthy Brother     Review of Systems  Constitutional: Negative for chills, fever and malaise/fatigue.  HENT: Positive for hearing loss (left ear). Negative for ear discharge and ear pain.   Eyes: Negative for blurred vision and double vision.  Respiratory: Negative for cough, shortness of breath and wheezing.   Cardiovascular: Negative for chest pain, palpitations and leg swelling.  Gastrointestinal: Negative for abdominal pain, blood in stool, constipation, diarrhea, heartburn, nausea and vomiting.  Genitourinary: Negative for dysuria, frequency and hematuria.  Musculoskeletal: Negative for back pain and joint pain.  Skin: Negative for rash.  Neurological: Negative for dizziness, weakness and headaches.     OBJECTIVE:  Today's Vitals   05/29/20 0800  BP: 111/70  Pulse: 75  Temp: 98.3 F (36.8 C)  SpO2: 100%  Weight: 112 lb (50.8 kg)  Height: 4' 11.5" (1.511 m)   Body mass index is 22.24 kg/m.   Physical Exam Vitals reviewed.  Constitutional:      Appearance: Normal appearance.  HENT:     Right  Ear: Ear canal and external ear normal. There is no impacted cerumen.     Left Ear: Ear canal and external ear normal. There is no impacted cerumen.     Nose: Nose normal.  Eyes:     Extraocular Movements: Extraocular movements intact.     Pupils: Pupils are equal, round, and reactive to light.  Cardiovascular:     Rate and Rhythm: Normal rate and regular rhythm.     Pulses: Normal pulses.     Heart sounds: Normal heart sounds.  Pulmonary:     Effort: Pulmonary effort is normal.     Breath sounds: Normal breath sounds.  Abdominal:     General: Bowel sounds are normal.     Palpations: Abdomen is soft.  Musculoskeletal:        General: Normal  range of motion.     Cervical back: Normal range of motion.  Skin:    General: Skin is warm and dry.     Capillary Refill: Capillary refill takes less than 2 seconds.  Neurological:     General: No focal deficit present.     Mental Status: She is alert and oriented to person, place, and time.  Psychiatric:        Mood and Affect: Mood normal.        Behavior: Behavior normal.     No results found for this or any previous visit (from the past 24 hour(s)).  No results found.   ASSESSMENT and PLAN  Problem List Items Addressed This Visit   None   Visit Diagnoses    Encounter for annual physical examination excluding gynecological examination in a patient older than 17 years    -  Primary   Relevant Orders   HIV Antibody (routine testing w rflx)   Hepatitis C antibody   CMP14+EGFR   Lipid Panel   CBC with Differential   TSH   Vitamin D, 25-hydroxy   Encounter for vaccination       Relevant Orders   Flu Vaccine QUAD 6+ mos PF IM (Fluarix Quad PF) (Completed)      Plan . Follow up with ENT as needed . Continue LFM . Will follow up with lab results . Declined pap at this time, r/se/b discussed   Return in about 1 year (around 05/29/2021).    Huston Foley Duffy Dantonio, FNP-BC Primary Care at [place], [place] [address] Ph.  [phone] Fax [phone]

## 2020-05-30 ENCOUNTER — Other Ambulatory Visit: Payer: Self-pay | Admitting: Family Medicine

## 2020-05-30 DIAGNOSIS — E559 Vitamin D deficiency, unspecified: Secondary | ICD-10-CM

## 2020-05-30 LAB — CMP14+EGFR
ALT: 7 IU/L (ref 0–32)
AST: 14 IU/L (ref 0–40)
Albumin/Globulin Ratio: 1.7 (ref 1.2–2.2)
Albumin: 4.7 g/dL (ref 3.9–5.0)
Alkaline Phosphatase: 41 IU/L — ABNORMAL LOW (ref 44–121)
BUN/Creatinine Ratio: 18 (ref 9–23)
BUN: 10 mg/dL (ref 6–20)
Bilirubin Total: 0.4 mg/dL (ref 0.0–1.2)
CO2: 20 mmol/L (ref 20–29)
Calcium: 9.4 mg/dL (ref 8.7–10.2)
Chloride: 104 mmol/L (ref 96–106)
Creatinine, Ser: 0.55 mg/dL — ABNORMAL LOW (ref 0.57–1.00)
Globulin, Total: 2.7 g/dL (ref 1.5–4.5)
Glucose: 83 mg/dL (ref 65–99)
Potassium: 4.5 mmol/L (ref 3.5–5.2)
Sodium: 140 mmol/L (ref 134–144)
Total Protein: 7.4 g/dL (ref 6.0–8.5)
eGFR: 130 mL/min/{1.73_m2} (ref >59)

## 2020-05-30 LAB — CBC WITH DIFFERENTIAL/PLATELET
Basophils Absolute: 0 10*3/uL (ref 0.0–0.2)
Basos: 0 %
EOS (ABSOLUTE): 0 10*3/uL (ref 0.0–0.4)
Eos: 1 %
Hematocrit: 37.9 % (ref 34.0–46.6)
Hemoglobin: 12.7 g/dL (ref 11.1–15.9)
Immature Grans (Abs): 0 10*3/uL (ref 0.0–0.1)
Immature Granulocytes: 0 %
Lymphocytes Absolute: 1.5 10*3/uL (ref 0.7–3.1)
Lymphs: 30 %
MCH: 29.3 pg (ref 26.6–33.0)
MCHC: 33.5 g/dL (ref 31.5–35.7)
MCV: 87 fL (ref 79–97)
Monocytes Absolute: 0.5 10*3/uL (ref 0.1–0.9)
Monocytes: 10 %
Neutrophils Absolute: 2.9 10*3/uL (ref 1.4–7.0)
Neutrophils: 59 %
Platelets: 276 10*3/uL (ref 150–450)
RBC: 4.34 x10E6/uL (ref 3.77–5.28)
RDW: 12.5 % (ref 11.7–15.4)
WBC: 5 10*3/uL (ref 3.4–10.8)

## 2020-05-30 LAB — LIPID PANEL
Chol/HDL Ratio: 2.4 ratio (ref 0.0–4.4)
Cholesterol, Total: 159 mg/dL (ref 100–199)
HDL: 65 mg/dL (ref >39)
LDL Chol Calc (NIH): 81 mg/dL (ref 0–99)
Triglycerides: 68 mg/dL (ref 0–149)
VLDL Cholesterol Cal: 13 mg/dL (ref 5–40)

## 2020-05-30 LAB — VITAMIN D 25 HYDROXY (VIT D DEFICIENCY, FRACTURES): Vit D, 25-Hydroxy: 13.5 ng/mL — ABNORMAL LOW (ref 30.0–100.0)

## 2020-05-30 LAB — HEPATITIS C ANTIBODY: Hep C Virus Ab: <0.1 s/co ratio (ref 0.0–0.9)

## 2020-05-30 LAB — TSH: TSH: 4.13 u[IU]/mL (ref 0.450–4.500)

## 2020-05-30 LAB — HIV ANTIBODY (ROUTINE TESTING W REFLEX): HIV Screen 4th Generation wRfx: NONREACTIVE

## 2020-05-30 MED ORDER — VITAMIN D (ERGOCALCIFEROL) 1.25 MG (50000 UNIT) PO CAPS: 50000.0000 [IU] | ORAL_CAPSULE | ORAL | 6 refills | Status: DC

## 2022-05-07 ENCOUNTER — Ambulatory Visit: Payer: BC Managed Care – PPO | Admitting: Nurse Practitioner

## 2022-05-07 ENCOUNTER — Encounter: Payer: Self-pay | Admitting: Nurse Practitioner

## 2022-05-07 VITALS — BP 126/80 | HR 83 | Temp 98.6°F | Ht 60.5 in | Wt 120.4 lb

## 2022-05-07 DIAGNOSIS — H6121 Impacted cerumen, right ear: Secondary | ICD-10-CM | POA: Diagnosis not present

## 2022-05-07 DIAGNOSIS — E559 Vitamin D deficiency, unspecified: Secondary | ICD-10-CM | POA: Diagnosis not present

## 2022-05-07 DIAGNOSIS — H9202 Otalgia, left ear: Secondary | ICD-10-CM

## 2022-05-07 DIAGNOSIS — Z0001 Encounter for general adult medical examination with abnormal findings: Secondary | ICD-10-CM

## 2022-05-07 DIAGNOSIS — N920 Excessive and frequent menstruation with regular cycle: Secondary | ICD-10-CM | POA: Diagnosis not present

## 2022-05-07 DIAGNOSIS — Z0000 Encounter for general adult medical examination without abnormal findings: Secondary | ICD-10-CM | POA: Diagnosis not present

## 2022-05-07 LAB — COMPREHENSIVE METABOLIC PANEL
ALT: 12 U/L (ref 0–35)
AST: 12 U/L (ref 0–37)
Albumin: 4.5 g/dL (ref 3.5–5.2)
Alkaline Phosphatase: 41 U/L (ref 39–117)
BUN: 10 mg/dL (ref 6–23)
CO2: 26 mEq/L (ref 19–32)
Calcium: 9.5 mg/dL (ref 8.4–10.5)
Chloride: 106 mEq/L (ref 96–112)
Creatinine, Ser: 0.61 mg/dL (ref 0.40–1.20)
GFR: 122.92 mL/min (ref >60.00)
Glucose, Bld: 79 mg/dL (ref 70–99)
Potassium: 4.2 mEq/L (ref 3.5–5.1)
Sodium: 140 mEq/L (ref 135–145)
Total Bilirubin: 0.4 mg/dL (ref 0.2–1.2)
Total Protein: 7.5 g/dL (ref 6.0–8.3)

## 2022-05-07 LAB — CBC WITH DIFFERENTIAL/PLATELET
Basophils Absolute: 0 10*3/uL (ref 0.0–0.1)
Basophils Relative: 0.6 % (ref 0.0–3.0)
Eosinophils Absolute: 0 10*3/uL (ref 0.0–0.7)
Eosinophils Relative: 1 % (ref 0.0–5.0)
HCT: 38.2 % (ref 36.0–46.0)
Hemoglobin: 12.7 g/dL (ref 12.0–15.0)
Lymphocytes Relative: 35.4 % (ref 12.0–46.0)
Lymphs Abs: 1.5 10*3/uL (ref 0.7–4.0)
MCHC: 33.2 g/dL (ref 30.0–36.0)
MCV: 87.1 fl (ref 78.0–100.0)
Monocytes Absolute: 0.5 10*3/uL (ref 0.1–1.0)
Monocytes Relative: 11.5 % (ref 3.0–12.0)
Neutro Abs: 2.2 10*3/uL (ref 1.4–7.7)
Neutrophils Relative %: 51.5 % (ref 43.0–77.0)
Platelets: 295 10*3/uL (ref 150.0–400.0)
RBC: 4.38 Mil/uL (ref 3.87–5.11)
RDW: 14 % (ref 11.5–15.5)
WBC: 4.3 10*3/uL (ref 4.0–10.5)

## 2022-05-07 LAB — VITAMIN D 25 HYDROXY (VIT D DEFICIENCY, FRACTURES): VITD: 12.43 ng/mL — ABNORMAL LOW (ref 30.00–100.00)

## 2022-05-07 LAB — TSH: TSH: 3.94 u[IU]/mL (ref 0.35–5.50)

## 2022-05-07 NOTE — Patient Instructions (Signed)
It was great to see you!  We are checking your labs today and will let you know the results via mychart/phone.   I have placed a referral to ENT, they will call to schedule.   Let's follow-up in 1 year, sooner if you have concerns.  If a referral was placed today, you will be contacted for an appointment. Please note that routine referrals can sometimes take up to 3-4 weeks to process. Please call our office if you haven't heard anything after this time frame.  Take care,  Vance Peper, NP

## 2022-05-07 NOTE — Assessment & Plan Note (Signed)
She has a hard time remembering to take vitamin D supplement daily. Will check vitamin D levels and treat based on results.

## 2022-05-07 NOTE — Assessment & Plan Note (Signed)
Chronic, stable. Continue ibuprofen 400-'800mg'$  TID prn cramping. Will check iron panel today. Follow-up if symptoms worsen or any concerns.

## 2022-05-07 NOTE — Progress Notes (Signed)
New Patient Visit  BP 126/80 (BP Location: Right Arm)   Pulse 83   Temp 98.6 F (37 C)   Ht 5' 0.5" (1.537 m)   Wt 120 lb 6.4 oz (54.6 kg)   LMP 05/05/2022   SpO2 99%   BMI 23.13 kg/m    Subjective:    Patient ID: Karen Conner, female    DOB: 13-Apr-1995, 27 y.o.   MRN: [id number]  CC: Chief Complaint  Patient presents with   Establish Care    NP. Est. Care, concerns with left ear pain and ringing in right ear    HPI: Karen Conner is a 27 y.o. female presents for new patient visit to establish care.  Introduced to Designer, jewellery role and practice setting.  All questions answered.  Discussed provider/patient relationship and expectations.  She has a history of heavy cramping with her menstrual periods. She is taking ibuprofen '400mg'$  as needed for pain which has been helping. She states her period flow is moderate and her cycles are regular.   She has a history of tympanostomy tube in her left ear which fell out. She was having pain and was supposed to have surgery before the pandemic, however she never got this rescheduled. She is having intermittnet pain to her left ear. She has also noticed ringing in her right ear. She wears headphones and ear pods, and doesn't list to loud sounds/music.   She has a history of vitamin D deficiency and has a hard time remembering to take her supplement every day. She would like to get these levels rechecked.   Depression and Anxiety Screen Done:     05/07/2022    8:50 AM 05/29/2020    8:03 AM 02/20/2019    2:50 PM 01/20/2018    4:22 PM 12/22/2015    1:55 PM  Depression screen PHQ 2/9  Decreased Interest 0 0 0 0 0  Down, Depressed, Hopeless 0 0 0 0 0  PHQ - 2 Score 0 0 0 0 0  Altered sleeping 1      Tired, decreased energy 1      Change in appetite 0      Feeling bad or failure about yourself  0      Trouble concentrating 0      Moving slowly or fidgety/restless 0      Suicidal thoughts 0      PHQ-9 Score 2      Difficult doing  work/chores Not difficult at all          05/07/2022    8:50 AM  GAD 7 : Generalized Anxiety Score  Nervous, Anxious, on Edge 1  Control/stop worrying 1  Worry too much - different things 1  Trouble relaxing 0  Restless 1  Easily annoyed or irritable 1  Afraid - awful might happen 0  Total GAD 7 Score 5  Anxiety Difficulty Not difficult at all    Past Medical History:  Diagnosis Date   Vitamin D deficiency     Past Surgical History:  Procedure Laterality Date   APPENDECTOMY     at age 48   TYMPANOSTOMY TUBE PLACEMENT Left     Family History  Problem Relation Age of Onset   Hypertension Maternal Grandmother    Hypertension Maternal Grandfather    Hypertension Paternal Grandmother    Hypertension Paternal Merchant navy officer    Healthy Mother    Healthy Father    Healthy Sister    Healthy Brother  Social History   Tobacco Use   Smoking status: Never   Smokeless tobacco: Never  Vaping Use   Vaping Use: Never used  Substance Use Topics   Alcohol use: Never   Drug use: Never    Current Outpatient Medications on File Prior to Visit  Medication Sig Dispense Refill   Vitamin D, Ergocalciferol, (DRISDOL) 1.25 MG (50000 UNIT) CAPS capsule Take 1 capsule (50,000 Units total) by mouth every 7 (seven) days. 5 capsule 6   No current facility-administered medications on file prior to visit.     Review of Systems  Constitutional:  Positive for fatigue. Negative for fever.  HENT:  Positive for ear pain and tinnitus. Negative for congestion, rhinorrhea and sore throat.   Eyes: Negative.   Respiratory: Negative.    Cardiovascular: Negative.   Gastrointestinal: Negative.   Genitourinary: Negative.   Musculoskeletal: Negative.   Skin: Negative.   Neurological: Negative.   Psychiatric/Behavioral: Negative.        Objective:    BP 126/80 (BP Location: Right Arm)   Pulse 83   Temp 98.6 F (37 C)   Ht 5' 0.5" (1.537 m)   Wt 120 lb 6.4 oz (54.6 kg)   LMP 05/05/2022    SpO2 99%   BMI 23.13 kg/m   Wt Readings from Last 3 Encounters:  05/07/22 120 lb 6.4 oz (54.6 kg)  05/29/20 112 lb (50.8 kg)  02/20/19 107 lb (48.5 kg)    BP Readings from Last 3 Encounters:  05/07/22 126/80  05/29/20 111/70  02/20/19 108/76    Physical Exam Vitals and nursing note reviewed.  Constitutional:      General: She is not in acute distress.    Appearance: Normal appearance.  HENT:     Head: Normocephalic and atraumatic.     Right Ear: External ear normal. There is impacted cerumen.     Left Ear: Tympanic membrane, ear canal and external ear normal.  Eyes:     Conjunctiva/sclera: Conjunctivae normal.  Cardiovascular:     Rate and Rhythm: Normal rate and regular rhythm.     Pulses: Normal pulses.     Heart sounds: Normal heart sounds.  Pulmonary:     Effort: Pulmonary effort is normal.     Breath sounds: Normal breath sounds.  Abdominal:     Palpations: Abdomen is soft.     Tenderness: There is no abdominal tenderness.  Musculoskeletal:        General: Normal range of motion.     Cervical back: Normal range of motion and neck supple.     Right lower leg: No edema.     Left lower leg: No edema.  Lymphadenopathy:     Cervical: No cervical adenopathy.  Skin:    General: Skin is warm and dry.  Neurological:     General: No focal deficit present.     Mental Status: She is alert and oriented to person, place, and time.     Cranial Nerves: No cranial nerve deficit.     Coordination: Coordination normal.     Gait: Gait normal.  Psychiatric:        Mood and Affect: Mood normal.        Behavior: Behavior normal.        Thought Content: Thought content normal.        Judgment: Judgment normal.        Assessment & Plan:   Problem List Items Addressed This Visit       Other  Vitamin D deficiency    She has a hard time remembering to take vitamin D supplement daily. Will check vitamin D levels and treat based on results.       Relevant Orders    VITAMIN D 25 Hydroxy (Vit-D Deficiency, Fractures)   Menorrhagia with regular cycle    Chronic, stable. Continue ibuprofen 400-'800mg'$  TID prn cramping. Will check iron panel today. Follow-up if symptoms worsen or any concerns.       Relevant Orders   Iron, TIBC and Ferritin Panel   Other Visit Diagnoses     Encounter for general adult medical examination with abnormal findings    -  Primary   Health maintenance reviewed and updated. Check CMP, CBC, TSH today. Discussed nutrition, exercise. Declines pap. Follow-up 1 year   Relevant Orders   CBC with Differential/Platelet   Comprehensive metabolic panel   TSH   Left ear pain       History of tympanostomy tube and was supposed to have surgery before pandemic. Will place referral to ENT.   Relevant Orders   Ambulatory referral to ENT   Impacted cerumen of right ear       After obtaining verbal consent, ear irrigated with goood results .She tolerated the procedure well.       LABORATORY TESTING:  - Pap smear:  declined  IMMUNIZATIONS:   - Tdap: Tetanus vaccination status reviewed: last tetanus booster within 10 years. - Influenza: Refused - Pneumovax: Not applicable - Prevnar: Not applicable - HPV: Up to date - Zostavax vaccine: Not applicable  SCREENING: -Mammogram: Not applicable  - Colonoscopy: Not applicable  - Bone Density: Not applicable  -Hearing Test: Not applicable  -Spirometry: Not applicable   PATIENT COUNSELING:   Advised to take 1 mg of folate supplement per day if capable of pregnancy.   Sexuality: Discussed sexually transmitted diseases, partner selection, use of condoms, avoidance of unintended pregnancy  and contraceptive alternatives.   Advised to avoid cigarette smoking.  I discussed with the patient that most people either abstain from alcohol or drink within safe limits (<=14/week and <=4 drinks/occasion for males, <=7/weeks and <= 3 drinks/occasion for females) and that the risk for alcohol disorders  and other health effects rises proportionally with the number of drinks per week and how often a drinker exceeds daily limits.  Discussed cessation/primary prevention of drug use and availability of treatment for abuse.   Diet: Encouraged to adjust caloric intake to maintain  or achieve ideal body weight, to reduce intake of dietary saturated fat and total fat, to limit sodium intake by avoiding high sodium foods and not adding table salt, and to maintain adequate dietary potassium and calcium preferably from fresh fruits, vegetables, and low-fat dairy products.    stressed the importance of regular exercise  Injury prevention: Discussed safety belts, safety helmets, smoke detector, smoking near bedding or upholstery.   Dental health: Discussed importance of regular tooth brushing, flossing, and dental visits.    NEXT PREVENTATIVE PHYSICAL DUE IN 1 YEAR.   Follow up plan: Return in about 1 year (around 05/08/2023) for CPE.

## 2022-05-08 LAB — IRON,TIBC AND FERRITIN PANEL
%SAT: 23 % (calc) (ref 16–45)
Ferritin: 6 ng/mL — ABNORMAL LOW (ref 16–154)
Iron: 81 ug/dL (ref 40–190)
TIBC: 345 mcg/dL (calc) (ref 250–450)

## 2022-05-10 MED ORDER — VITAMIN D (ERGOCALCIFEROL) 1.25 MG (50000 UNIT) PO CAPS: 50000.0000 [IU] | ORAL_CAPSULE | ORAL | 1 refills | Status: DC

## 2022-05-10 NOTE — Addendum Note (Signed)
Addended by: Vance Peper A on: 05/10/2022 08:47 AM   Modules accepted: Orders

## 2022-05-20 ENCOUNTER — Ambulatory Visit: Payer: Self-pay | Admitting: Nurse Practitioner

## 2023-05-09 ENCOUNTER — Encounter: Payer: BC Managed Care – PPO | Admitting: Nurse Practitioner

## 2023-05-27 ENCOUNTER — Ambulatory Visit (INDEPENDENT_AMBULATORY_CARE_PROVIDER_SITE_OTHER): Payer: BC Managed Care – PPO | Admitting: Nurse Practitioner

## 2023-05-27 ENCOUNTER — Encounter: Payer: Self-pay | Admitting: Nurse Practitioner

## 2023-05-27 VITALS — BP 124/62 | HR 70 | Temp 97.9°F | Ht 60.5 in | Wt 119.4 lb

## 2023-05-27 DIAGNOSIS — E559 Vitamin D deficiency, unspecified: Secondary | ICD-10-CM

## 2023-05-27 DIAGNOSIS — Z0000 Encounter for general adult medical examination without abnormal findings: Secondary | ICD-10-CM | POA: Insufficient documentation

## 2023-05-27 DIAGNOSIS — E611 Iron deficiency: Secondary | ICD-10-CM | POA: Insufficient documentation

## 2023-05-27 LAB — CBC WITH DIFFERENTIAL/PLATELET
Basophils Absolute: 0 10*3/uL (ref 0.0–0.1)
Basophils Relative: 0.4 % (ref 0.0–3.0)
Eosinophils Absolute: 0 10*3/uL (ref 0.0–0.7)
Eosinophils Relative: 0.9 % (ref 0.0–5.0)
HCT: 38.5 % (ref 36.0–46.0)
Hemoglobin: 12.7 g/dL (ref 12.0–15.0)
Lymphocytes Relative: 33.1 % (ref 12.0–46.0)
Lymphs Abs: 1.7 10*3/uL (ref 0.7–4.0)
MCHC: 33 g/dL (ref 30.0–36.0)
MCV: 88 fl (ref 78.0–100.0)
Monocytes Absolute: 0.7 10*3/uL (ref 0.1–1.0)
Monocytes Relative: 13.6 % — ABNORMAL HIGH (ref 3.0–12.0)
Neutro Abs: 2.7 10*3/uL (ref 1.4–7.7)
Neutrophils Relative %: 52 % (ref 43.0–77.0)
Platelets: 300 10*3/uL (ref 150.0–400.0)
RBC: 4.37 Mil/uL (ref 3.87–5.11)
RDW: 13.4 % (ref 11.5–15.5)
WBC: 5.3 10*3/uL (ref 4.0–10.5)

## 2023-05-27 LAB — COMPREHENSIVE METABOLIC PANEL
ALT: 9 U/L (ref 0–35)
AST: 13 U/L (ref 0–37)
Albumin: 4.7 g/dL (ref 3.5–5.2)
Alkaline Phosphatase: 36 U/L — ABNORMAL LOW (ref 39–117)
BUN: 9 mg/dL (ref 6–23)
CO2: 26 meq/L (ref 19–32)
Calcium: 9.8 mg/dL (ref 8.4–10.5)
Chloride: 103 meq/L (ref 96–112)
Creatinine, Ser: 0.59 mg/dL (ref 0.40–1.20)
GFR: 123 mL/min (ref >60.00)
Glucose, Bld: 88 mg/dL (ref 70–99)
Potassium: 3.9 meq/L (ref 3.5–5.1)
Sodium: 137 meq/L (ref 135–145)
Total Bilirubin: 0.4 mg/dL (ref 0.2–1.2)
Total Protein: 7.7 g/dL (ref 6.0–8.3)

## 2023-05-27 LAB — VITAMIN D 25 HYDROXY (VIT D DEFICIENCY, FRACTURES): VITD: 33.94 ng/mL (ref 30.00–100.00)

## 2023-05-27 LAB — FERRITIN: Ferritin: 8.6 ng/mL — ABNORMAL LOW (ref 10.0–291.0)

## 2023-05-27 LAB — IRON: Iron: 68 ug/dL (ref 42–145)

## 2023-05-27 NOTE — Patient Instructions (Signed)
 It was great to see you!  We are checking your labs today and will let you know the results via mychart/phone.   Keep up the great work!   You can use debrox over the counter to help with the itching/ear wax.   Let's follow-up in 1 year, sooner if you have concerns.  If a referral was placed today, you will be contacted for an appointment. Please note that routine referrals can sometimes take up to 3-4 weeks to process. Please call our office if you haven't heard anything after this time frame.  Take care,  Rodman Pickle, NP

## 2023-05-27 NOTE — Progress Notes (Signed)
 BP 124/62 (BP Location: Left Arm, Patient Position: Sitting, Cuff Size: Small)   Pulse 70   Temp 97.9 F (36.6 C)   Ht 5' 0.5" (1.537 m)   Wt 119 lb 6.4 oz (54.2 kg)   LMP 04/25/2023 (Exact Date)   SpO2 100%   BMI 22.93 kg/m    Subjective:    Patient ID: Karen Conner, female    DOB: 09/25/1995, 28 y.o.   MRN: [id number]  CC: Chief Complaint  Patient presents with   Annual Exam    With fasting labs    HPI: Karen Conner is a 28 y.o. female presenting on 05/27/2023 for comprehensive medical examination. Current medical complaints include:none  She currently lives with: parents Menopausal Symptoms: no  Depression and Anxiety Screen done today and results listed below:     05/27/2023    8:42 AM 05/07/2022    8:50 AM 05/29/2020    8:03 AM 02/20/2019    2:50 PM 01/20/2018    4:22 PM  Depression screen PHQ 2/9  Decreased Interest 0 0 0 0 0  Down, Depressed, Hopeless 0 0 0 0 0  PHQ - 2 Score 0 0 0 0 0  Altered sleeping 1 1     Tired, decreased energy 2 1     Change in appetite 1 0     Feeling bad or failure about yourself  0 0     Trouble concentrating 0 0     Moving slowly or fidgety/restless 0 0     Suicidal thoughts 0 0     PHQ-9 Score 4 2     Difficult doing work/chores Not difficult at all Not difficult at all         05/27/2023    8:42 AM 05/07/2022    8:50 AM  GAD 7 : Generalized Anxiety Score  Nervous, Anxious, on Edge 0 1  Control/stop worrying 0 1  Worry too much - different things 0 1  Trouble relaxing 0 0  Restless 0 1  Easily annoyed or irritable 0 1  Afraid - awful might happen 0 0  Total GAD 7 Score 0 5  Anxiety Difficulty Not difficult at all Not difficult at all    The patient does not have a history of falls. I did not complete a risk assessment for falls. A plan of care for falls was not documented.   Past Medical History:  Past Medical History:  Diagnosis Date   Vitamin D deficiency     Surgical History:  Past Surgical History:   Procedure Laterality Date   APPENDECTOMY     at age 6   TYMPANOSTOMY TUBE PLACEMENT Left     Medications:  Current Outpatient Medications on File Prior to Visit  Medication Sig   VITAMIN D PO Take 5,000 Int'l Units/day by mouth daily.   No current facility-administered medications on file prior to visit.    Allergies:  No Known Allergies  Social History:  Social History   Socioeconomic History   Marital status: Single    Spouse name: Not on file   Number of children: Not on file   Years of education: Not on file   Highest education level: Not on file  Occupational History   Not on file  Tobacco Use   Smoking status: Never   Smokeless tobacco: Never  Vaping Use   Vaping status: Never Used  Substance and Sexual Activity   Alcohol use: Never   Drug use: Never   Sexual  activity: Never  Other Topics Concern   Not on file  Social History Narrative   Not on file   Social Drivers of Health   Financial Resource Strain: Not on file  Food Insecurity: Not on file  Transportation Needs: Not on file  Physical Activity: Not on file  Stress: Not on file  Social Connections: Not on file  Intimate Partner Violence: Not on file   Social History   Tobacco Use  Smoking Status Never  Smokeless Tobacco Never   Social History   Substance and Sexual Activity  Alcohol Use Never    Family History:  Family History  Problem Relation Age of Onset   Hypertension Maternal Grandmother    Hypertension Maternal Grandfather    Hypertension Paternal Grandmother    Hypertension Paternal Actor    Healthy Mother    Healthy Father    Healthy Sister    Healthy Brother     Past medical history, surgical history, medications, allergies, family history and social history reviewed with patient today and changes made to appropriate areas of the chart.   Review of Systems  Constitutional: Negative.   HENT:  Negative for ear pain.        Left ear itching  Eyes: Negative.    Respiratory: Negative.    Cardiovascular: Negative.   Gastrointestinal: Negative.   Genitourinary: Negative.   Musculoskeletal: Negative.   Skin: Negative.   Neurological: Negative.   Psychiatric/Behavioral: Negative.     All other ROS negative except what is listed above and in the HPI.      Objective:    BP 124/62 (BP Location: Left Arm, Patient Position: Sitting, Cuff Size: Small)   Pulse 70   Temp 97.9 F (36.6 C)   Ht 5' 0.5" (1.537 m)   Wt 119 lb 6.4 oz (54.2 kg)   LMP 04/25/2023 (Exact Date)   SpO2 100%   BMI 22.93 kg/m   Wt Readings from Last 3 Encounters:  05/27/23 119 lb 6.4 oz (54.2 kg)  05/07/22 120 lb 6.4 oz (54.6 kg)  05/29/20 112 lb (50.8 kg)    Physical Exam Vitals and nursing note reviewed.  Constitutional:      General: She is not in acute distress.    Appearance: Normal appearance.  HENT:     Head: Normocephalic and atraumatic.     Right Ear: Tympanic membrane, ear canal and external ear normal.     Left Ear: Tympanic membrane, ear canal and external ear normal.  Eyes:     Conjunctiva/sclera: Conjunctivae normal.  Cardiovascular:     Rate and Rhythm: Normal rate and regular rhythm.     Pulses: Normal pulses.     Heart sounds: Normal heart sounds.  Pulmonary:     Effort: Pulmonary effort is normal.     Breath sounds: Normal breath sounds.  Abdominal:     Palpations: Abdomen is soft.     Tenderness: There is no abdominal tenderness.  Musculoskeletal:        General: Normal range of motion.     Cervical back: Normal range of motion and neck supple.     Right lower leg: No edema.     Left lower leg: No edema.  Lymphadenopathy:     Cervical: No cervical adenopathy.  Skin:    General: Skin is warm and dry.  Neurological:     General: No focal deficit present.     Mental Status: She is alert and oriented to person, place, and time.  Cranial Nerves: No cranial nerve deficit.     Coordination: Coordination normal.     Gait: Gait normal.   Psychiatric:        Mood and Affect: Mood normal.        Behavior: Behavior normal.        Thought Content: Thought content normal.        Judgment: Judgment normal.     Results for orders placed or performed in visit on 05/07/22  CBC with Differential/Platelet   Collection Time: 05/07/22  8:46 AM  Result Value Ref Range   WBC 4.3 4.0 - 10.5 K/uL   RBC 4.38 3.87 - 5.11 Mil/uL   Hemoglobin 12.7 12.0 - 15.0 g/dL   HCT 47.8 29.5 - 62.1 %   MCV 87.1 78.0 - 100.0 fl   MCHC 33.2 30.0 - 36.0 g/dL   RDW 30.8 65.7 - 84.6 %   Platelets 295.0 150.0 - 400.0 K/uL   Neutrophils Relative % 51.5 43.0 - 77.0 %   Lymphocytes Relative 35.4 12.0 - 46.0 %   Monocytes Relative 11.5 3.0 - 12.0 %   Eosinophils Relative 1.0 0.0 - 5.0 %   Basophils Relative 0.6 0.0 - 3.0 %   Neutro Abs 2.2 1.4 - 7.7 K/uL   Lymphs Abs 1.5 0.7 - 4.0 K/uL   Monocytes Absolute 0.5 0.1 - 1.0 K/uL   Eosinophils Absolute 0.0 0.0 - 0.7 K/uL   Basophils Absolute 0.0 0.0 - 0.1 K/uL  VITAMIN D 25 Hydroxy (Vit-D Deficiency, Fractures)   Collection Time: 05/07/22  8:46 AM  Result Value Ref Range   VITD 12.43 (L) 30.00 - 100.00 ng/mL  Comprehensive metabolic panel   Collection Time: 05/07/22  8:46 AM  Result Value Ref Range   Sodium 140 135 - 145 mEq/L   Potassium 4.2 3.5 - 5.1 mEq/L   Chloride 106 96 - 112 mEq/L   CO2 26 19 - 32 mEq/L   Glucose, Bld 79 70 - 99 mg/dL   BUN 10 6 - 23 mg/dL   Creatinine, Ser 9.62 0.40 - 1.20 mg/dL   Total Bilirubin 0.4 0.2 - 1.2 mg/dL   Alkaline Phosphatase 41 39 - 117 U/L   AST 12 0 - 37 U/L   ALT 12 0 - 35 U/L   Total Protein 7.5 6.0 - 8.3 g/dL   Albumin 4.5 3.5 - 5.2 g/dL   GFR 952.84 >13.24 mL/min   Calcium 9.5 8.4 - 10.5 mg/dL  Iron, TIBC and Ferritin Panel   Collection Time: 05/07/22  8:46 AM  Result Value Ref Range   Iron 81 40 - 190 mcg/dL   TIBC 401 027 - 253 mcg/dL (calc)   %SAT 23 16 - 45 % (calc)   Ferritin 6 (L) 16 - 154 ng/mL  TSH   Collection Time: 05/07/22  8:46  AM  Result Value Ref Range   TSH 3.94 0.35 - 5.50 uIU/mL      Assessment & Plan:   Problem List Items Addressed This Visit       Other   Vitamin D deficiency   Currently taking vitamin D 5,000 units daily. Check vitamin D levels and adjust regimen based on results.       Relevant Orders   VITAMIN D 25 Hydroxy (Vit-D Deficiency, Fractures)   Iron deficiency   Ferritin slightly low last year. Check CBC, iron, ferritin and treat based on results.       Relevant Orders   CBC with Differential/Platelet  Iron   Ferritin   Routine general medical examination at a health care facility - Primary   Health maintenance reviewed and updated. Discussed nutrition, exercise. Check CMP, CBC today. Follow-up 1 year.        Relevant Orders   CBC with Differential/Platelet   Comprehensive metabolic panel     Follow up plan: Return in about 1 year (around 05/26/2024) for CPE.   LABORATORY TESTING:  - Pap smear:  declined  IMMUNIZATIONS:   - Tdap: Tetanus vaccination status reviewed: last tetanus booster within 10 years. - Influenza: Up to date - Pneumovax: Not applicable - Prevnar: Not applicable - HPV: Not applicable - Shingrix vaccine: Not applicable  SCREENING: -Mammogram: Not applicable  - Colonoscopy: Not applicable  - Bone Density: Not applicable   PATIENT COUNSELING:   Advised to take 1 mg of folate supplement per day if capable of pregnancy.   Sexuality: Discussed sexually transmitted diseases, partner selection, use of condoms, avoidance of unintended pregnancy  and contraceptive alternatives.   Advised to avoid cigarette smoking.  I discussed with the patient that most people either abstain from alcohol or drink within safe limits (<=14/week and <=4 drinks/occasion for males, <=7/weeks and <= 3 drinks/occasion for females) and that the risk for alcohol disorders and other health effects rises proportionally with the number of drinks per week and how often a drinker  exceeds daily limits.  Discussed cessation/primary prevention of drug use and availability of treatment for abuse.   Diet: Encouraged to adjust caloric intake to maintain  or achieve ideal body weight, to reduce intake of dietary saturated fat and total fat, to limit sodium intake by avoiding high sodium foods and not adding table salt, and to maintain adequate dietary potassium and calcium preferably from fresh fruits, vegetables, and low-fat dairy products.    stressed the importance of regular exercise  Injury prevention: Discussed safety belts, safety helmets, smoke detector, smoking near bedding or upholstery.   Dental health: Discussed importance of regular tooth brushing, flossing, and dental visits.    NEXT PREVENTATIVE PHYSICAL DUE IN 1 YEAR. Return in about 1 year (around 05/26/2024) for CPE.  Carlton Buskey A Zyan Mirkin

## 2023-05-27 NOTE — Assessment & Plan Note (Signed)
Health maintenance reviewed and updated. Discussed nutrition, exercise. Check CMP, CBC today. Follow-up 1 year.   

## 2023-05-27 NOTE — Assessment & Plan Note (Signed)
 Currently taking vitamin D 5,000 units daily. Check vitamin D levels and adjust regimen based on results.

## 2023-05-27 NOTE — Assessment & Plan Note (Signed)
 Ferritin slightly low last year. Check CBC, iron, ferritin and treat based on results.

## 2024-01-20 ENCOUNTER — Encounter: Payer: Self-pay | Admitting: Nurse Practitioner

## 2024-05-28 ENCOUNTER — Encounter: Admitting: Nurse Practitioner
